# Patient Record
Sex: Male | Born: 1945 | Race: Black or African American | Hispanic: No | Marital: Single
Health system: Southern US, Community
[De-identification: ages and names within clinical notes are randomized; demographics above are authoritative.]

---

## 2020-04-23 ENCOUNTER — Emergency Department (HOSPITAL_COMMUNITY): Payer: Medicare Other

## 2020-04-23 ENCOUNTER — Emergency Department (HOSPITAL_COMMUNITY)
Admission: EM | Admit: 2020-04-23 | Discharge: 2020-04-24 | Disposition: A | Discharge status: Home / Self Care | Payer: Medicare Other | Attending: Emergency Medicine | Admitting: Emergency Medicine

## 2020-04-23 DIAGNOSIS — Y92410 Unspecified street and highway as the place of occurrence of the external cause: Secondary | ICD-10-CM | POA: Insufficient documentation

## 2020-04-23 DIAGNOSIS — I700 Atherosclerosis of aorta: Secondary | ICD-10-CM | POA: Insufficient documentation

## 2020-04-23 DIAGNOSIS — R4182 Altered mental status, unspecified: Secondary | ICD-10-CM | POA: Diagnosis not present

## 2020-04-23 DIAGNOSIS — S3991XA Unspecified injury of abdomen, initial encounter: Secondary | ICD-10-CM | POA: Diagnosis not present

## 2020-04-23 DIAGNOSIS — Y907 Blood alcohol level of 200-239 mg/100 ml: Secondary | ICD-10-CM | POA: Diagnosis not present

## 2020-04-23 DIAGNOSIS — T1490XA Injury, unspecified, initial encounter: Secondary | ICD-10-CM

## 2020-04-23 DIAGNOSIS — S0990XA Unspecified injury of head, initial encounter: Secondary | ICD-10-CM | POA: Diagnosis present

## 2020-04-23 DIAGNOSIS — R780 Finding of alcohol in blood: Secondary | ICD-10-CM | POA: Diagnosis not present

## 2020-04-23 LAB — URINALYSIS, ROUTINE W REFLEX MICROSCOPIC
Bilirubin Urine: NEGATIVE
Glucose, UA: NEGATIVE mg/dL
Hgb urine dipstick: NEGATIVE
Ketones, ur: NEGATIVE mg/dL
Leukocytes,Ua: NEGATIVE
Nitrite: NEGATIVE
Protein, ur: NEGATIVE mg/dL
Specific Gravity, Urine: 1.003 — ABNORMAL LOW (ref 1.005–1.030)
pH: 6 (ref 5.0–8.0)

## 2020-04-23 LAB — COMPREHENSIVE METABOLIC PANEL
ALT: 28 U/L (ref 0–44)
AST: 26 U/L (ref 15–41)
Albumin: 4.1 g/dL (ref 3.5–5.0)
Alkaline Phosphatase: 58 U/L (ref 38–126)
Anion gap: 14 (ref 5–15)
BUN: 16 mg/dL (ref 8–23)
CO2: 28 mmol/L (ref 22–32)
Calcium: 10.2 mg/dL (ref 8.9–10.3)
Chloride: 95 mmol/L — ABNORMAL LOW (ref 98–111)
Creatinine, Ser: 1.46 mg/dL — ABNORMAL HIGH (ref 0.61–1.24)
GFR, Estimated: 50 mL/min — ABNORMAL LOW (ref >60)
Glucose, Bld: 112 mg/dL — ABNORMAL HIGH (ref 70–99)
Potassium: 3.4 mmol/L — ABNORMAL LOW (ref 3.5–5.1)
Sodium: 137 mmol/L (ref 135–145)
Total Bilirubin: 1.6 mg/dL — ABNORMAL HIGH (ref 0.3–1.2)
Total Protein: 7.3 g/dL (ref 6.5–8.1)

## 2020-04-23 LAB — I-STAT CHEM 8, ED
BUN: 19 mg/dL (ref 8–23)
Calcium, Ion: 1.12 mmol/L — ABNORMAL LOW (ref 1.15–1.40)
Chloride: 96 mmol/L — ABNORMAL LOW (ref 98–111)
Creatinine, Ser: 1.9 mg/dL — ABNORMAL HIGH (ref 0.61–1.24)
Glucose, Bld: 107 mg/dL — ABNORMAL HIGH (ref 70–99)
HCT: 47 % (ref 39.0–52.0)
Hemoglobin: 16 g/dL (ref 13.0–17.0)
Potassium: 3.3 mmol/L — ABNORMAL LOW (ref 3.5–5.1)
Sodium: 136 mmol/L (ref 135–145)
TCO2: 28 mmol/L (ref 22–32)

## 2020-04-23 LAB — CBC
HCT: 45.6 % (ref 39.0–52.0)
Hemoglobin: 15.9 g/dL (ref 13.0–17.0)
MCH: 31.5 pg (ref 26.0–34.0)
MCHC: 34.9 g/dL (ref 30.0–36.0)
MCV: 90.5 fL (ref 80.0–100.0)
Platelets: 167 10*3/uL (ref 150–400)
RBC: 5.04 MIL/uL (ref 4.22–5.81)
RDW: 13.2 % (ref 11.5–15.5)
WBC: 6.1 10*3/uL (ref 4.0–10.5)
nRBC: 0 % (ref 0.0–0.2)

## 2020-04-23 LAB — PROTIME-INR
INR: 1 (ref 0.8–1.2)
Prothrombin Time: 12.3 seconds (ref 11.4–15.2)

## 2020-04-23 LAB — ETHANOL: Alcohol, Ethyl (B): 236 mg/dL — ABNORMAL HIGH (ref <10)

## 2020-04-23 LAB — SAMPLE TO BLOOD BANK

## 2020-04-23 LAB — RAPID URINE DRUG SCREEN, HOSP PERFORMED
Amphetamines: NOT DETECTED
Barbiturates: NOT DETECTED
Benzodiazepines: NOT DETECTED
Cocaine: NOT DETECTED
Opiates: NOT DETECTED
Tetrahydrocannabinol: NOT DETECTED

## 2020-04-23 LAB — LACTIC ACID, PLASMA: Lactic Acid, Venous: 3.2 mmol/L (ref 0.5–1.9)

## 2020-04-23 MED ORDER — IOHEXOL 300 MG/ML  SOLN
100.0000 mL | Freq: Once | INTRAMUSCULAR | Status: AC | PRN
  Administered 2020-04-23: 100 mL via INTRAVENOUS

## 2020-04-23 MED ORDER — LORAZEPAM 2 MG/ML IJ SOLN
0.5000 mg | Freq: Once | INTRAMUSCULAR | Status: AC
  Administered 2020-04-23: 20:00:00 0.5 mg via INTRAVENOUS
  Filled 2020-04-23: qty 1

## 2020-04-23 MED ORDER — LORAZEPAM 2 MG/ML IJ SOLN
0.5000 mg | Freq: Once | INTRAMUSCULAR | Status: DC
  Filled 2020-04-23: qty 1

## 2020-04-23 NOTE — ED Notes (Signed)
Woody, RN attempted IV x 2 without success. Pt resting comfortably and cooperative at this time. Daughter at bedside.
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
## 2020-04-23 NOTE — ED Notes (Signed)
Pt removed IV.
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
## 2020-04-23 NOTE — ED Notes (Signed)
Pt removed c-collar, pulse-ox and BP cuff.

## 2020-04-23 NOTE — ED Notes (Signed)
Pt removed second IV.
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
## 2022-08-12 DIAGNOSIS — E7800 Pure hypercholesterolemia, unspecified: Secondary | ICD-10-CM | POA: Diagnosis not present

## 2022-08-12 DIAGNOSIS — R002 Palpitations: Secondary | ICD-10-CM | POA: Diagnosis not present

## 2022-08-12 DIAGNOSIS — I351 Nonrheumatic aortic (valve) insufficiency: Secondary | ICD-10-CM | POA: Diagnosis not present

## 2022-08-12 DIAGNOSIS — I10 Essential (primary) hypertension: Secondary | ICD-10-CM | POA: Diagnosis not present
